# Patient Record
Sex: Male | Born: 1950 | Race: White | Hispanic: No | Marital: Single | Smoking: Current every day smoker
Health system: Southern US, Community
[De-identification: ages and names within clinical notes are randomized; demographics above are authoritative.]

## PROBLEM LIST (undated history)

## (undated) DIAGNOSIS — J449 Chronic obstructive pulmonary disease, unspecified: Secondary | ICD-10-CM

## (undated) DIAGNOSIS — I10 Essential (primary) hypertension: Secondary | ICD-10-CM

## (undated) HISTORY — PX: CARDIAC SURGERY: SHX584

---

## 2017-09-07 ENCOUNTER — Emergency Department: Payer: Medicare Other

## 2017-09-07 ENCOUNTER — Encounter: Payer: Self-pay | Admitting: Emergency Medicine

## 2017-09-07 ENCOUNTER — Inpatient Hospital Stay
Admission: EM | Admit: 2017-09-07 | Discharge: 2017-09-11 | DRG: 190 | Disposition: A | Discharge status: Home / Self Care | Payer: Medicare Other | Attending: Internal Medicine | Admitting: Internal Medicine

## 2017-09-07 ENCOUNTER — Other Ambulatory Visit: Payer: Self-pay

## 2017-09-07 DIAGNOSIS — I10 Essential (primary) hypertension: Secondary | ICD-10-CM | POA: Diagnosis present

## 2017-09-07 DIAGNOSIS — I2510 Atherosclerotic heart disease of native coronary artery without angina pectoris: Secondary | ICD-10-CM | POA: Diagnosis present

## 2017-09-07 DIAGNOSIS — J9601 Acute respiratory failure with hypoxia: Secondary | ICD-10-CM | POA: Diagnosis present

## 2017-09-07 DIAGNOSIS — J441 Chronic obstructive pulmonary disease with (acute) exacerbation: Secondary | ICD-10-CM | POA: Diagnosis present

## 2017-09-07 DIAGNOSIS — F17210 Nicotine dependence, cigarettes, uncomplicated: Secondary | ICD-10-CM | POA: Diagnosis present

## 2017-09-07 DIAGNOSIS — Z951 Presence of aortocoronary bypass graft: Secondary | ICD-10-CM

## 2017-09-07 DIAGNOSIS — Z79899 Other long term (current) drug therapy: Secondary | ICD-10-CM | POA: Diagnosis not present

## 2017-09-07 DIAGNOSIS — Z66 Do not resuscitate: Secondary | ICD-10-CM | POA: Diagnosis present

## 2017-09-07 DIAGNOSIS — J9602 Acute respiratory failure with hypercapnia: Secondary | ICD-10-CM | POA: Diagnosis present

## 2017-09-07 HISTORY — DX: Essential (primary) hypertension: I10

## 2017-09-07 HISTORY — DX: Chronic obstructive pulmonary disease, unspecified: J44.9

## 2017-09-07 LAB — BASIC METABOLIC PANEL
ANION GAP: 6 (ref 5–15)
BUN: 17 mg/dL (ref 8–23)
CHLORIDE: 100 mmol/L (ref 98–111)
CO2: 31 mmol/L (ref 22–32)
Calcium: 8.7 mg/dL — ABNORMAL LOW (ref 8.9–10.3)
Creatinine, Ser: 0.51 mg/dL — ABNORMAL LOW (ref 0.61–1.24)
GFR calc non Af Amer: >60 mL/min (ref >60)
Glucose, Bld: 127 mg/dL — ABNORMAL HIGH (ref 70–99)
POTASSIUM: 3.9 mmol/L (ref 3.5–5.1)
SODIUM: 137 mmol/L (ref 135–145)

## 2017-09-07 LAB — BLOOD GAS, VENOUS
Acid-Base Excess: 6 mmol/L — ABNORMAL HIGH (ref 0.0–2.0)
Bicarbonate: 36.2 mmol/L — ABNORMAL HIGH (ref 20.0–28.0)
O2 Saturation: 86.5 %
Patient temperature: 37
pCO2, Ven: 77 mmHg (ref 44.0–60.0)
pH, Ven: 7.28 (ref 7.250–7.430)
pO2, Ven: 59 mmHg — ABNORMAL HIGH (ref 32.0–45.0)

## 2017-09-07 LAB — CBC
HCT: 45.4 % (ref 40.0–52.0)
HEMOGLOBIN: 15.9 g/dL (ref 13.0–18.0)
MCH: 32.7 pg (ref 26.0–34.0)
MCHC: 35.1 g/dL (ref 32.0–36.0)
MCV: 93.3 fL (ref 80.0–100.0)
PLATELETS: 203 10*3/uL (ref 150–440)
RBC: 4.87 MIL/uL (ref 4.40–5.90)
RDW: 13.7 % (ref 11.5–14.5)
WBC: 9.1 10*3/uL (ref 3.8–10.6)

## 2017-09-07 LAB — TROPONIN I: Troponin I: <0.03 ng/mL (ref <0.03)

## 2017-09-07 LAB — BRAIN NATRIURETIC PEPTIDE: B NATRIURETIC PEPTIDE 5: 81 pg/mL (ref 0.0–100.0)

## 2017-09-07 LAB — ETHANOL: Alcohol, Ethyl (B): <10 mg/dL (ref <10)

## 2017-09-07 LAB — GLUCOSE, CAPILLARY: Glucose-Capillary: 145 mg/dL — ABNORMAL HIGH (ref 70–99)

## 2017-09-07 MED ORDER — ATORVASTATIN CALCIUM 20 MG PO TABS
40.0000 mg | ORAL_TABLET | Freq: Every day | ORAL | Status: DC
  Administered 2017-09-07 – 2017-09-10 (×4): 40 mg via ORAL
  Filled 2017-09-07 (×4): qty 2

## 2017-09-07 MED ORDER — ACETAMINOPHEN 325 MG PO TABS: 650.0000 mg | ORAL_TABLET | Freq: Four times a day (QID) | ORAL | Status: DC | PRN

## 2017-09-07 MED ORDER — IPRATROPIUM-ALBUTEROL 0.5-2.5 (3) MG/3ML IN SOLN
3.0000 mL | Freq: Once | RESPIRATORY_TRACT | Status: AC
  Administered 2017-09-07: 3 mL via RESPIRATORY_TRACT
  Filled 2017-09-07: qty 6

## 2017-09-07 MED ORDER — POLYETHYLENE GLYCOL 3350 17 G PO PACK
17.0000 g | PACK | Freq: Every day | ORAL | Status: DC | PRN
  Administered 2017-09-08: 17 g via ORAL

## 2017-09-07 MED ORDER — IPRATROPIUM-ALBUTEROL 0.5-2.5 (3) MG/3ML IN SOLN
3.0000 mL | Freq: Once | RESPIRATORY_TRACT | Status: AC
  Administered 2017-09-07: 3 mL via RESPIRATORY_TRACT

## 2017-09-07 MED ORDER — METHYLPREDNISOLONE SODIUM SUCC 125 MG IJ SOLR
60.0000 mg | Freq: Two times a day (BID) | INTRAMUSCULAR | Status: DC
  Administered 2017-09-07 – 2017-09-08 (×2): 60 mg via INTRAVENOUS
  Filled 2017-09-07 (×2): qty 2

## 2017-09-07 MED ORDER — ONDANSETRON HCL 4 MG/2ML IJ SOLN: 4.0000 mg | Freq: Four times a day (QID) | INTRAMUSCULAR | Status: DC | PRN

## 2017-09-07 MED ORDER — TIOTROPIUM BROMIDE MONOHYDRATE 18 MCG IN CAPS: 18.0000 ug | ORAL_CAPSULE | Freq: Every day | RESPIRATORY_TRACT | Status: DC

## 2017-09-07 MED ORDER — BUDESONIDE 0.25 MG/2ML IN SUSP
0.2500 mg | Freq: Four times a day (QID) | RESPIRATORY_TRACT | Status: DC
  Administered 2017-09-07 – 2017-09-09 (×7): 0.25 mg via RESPIRATORY_TRACT
  Filled 2017-09-07 (×7): qty 2

## 2017-09-07 MED ORDER — IPRATROPIUM-ALBUTEROL 0.5-2.5 (3) MG/3ML IN SOLN
3.0000 mL | Freq: Once | RESPIRATORY_TRACT | Status: AC
  Administered 2017-09-07: 3 mL via RESPIRATORY_TRACT
  Filled 2017-09-07: qty 3

## 2017-09-07 MED ORDER — ACETAMINOPHEN 650 MG RE SUPP: 650.0000 mg | Freq: Four times a day (QID) | RECTAL | Status: DC | PRN

## 2017-09-07 MED ORDER — IPRATROPIUM-ALBUTEROL 0.5-2.5 (3) MG/3ML IN SOLN
3.0000 mL | Freq: Four times a day (QID) | RESPIRATORY_TRACT | Status: DC
  Administered 2017-09-07 – 2017-09-11 (×14): 3 mL via RESPIRATORY_TRACT
  Filled 2017-09-07 (×17): qty 3

## 2017-09-07 MED ORDER — ORAL CARE MOUTH RINSE
15.0000 mL | Freq: Two times a day (BID) | OROMUCOSAL | Status: DC
Start: 2017-09-07 — End: 2017-09-11
  Administered 2017-09-08 – 2017-09-11 (×5): 15 mL via OROMUCOSAL

## 2017-09-07 MED ORDER — ALBUTEROL SULFATE (2.5 MG/3ML) 0.083% IN NEBU: 2.5000 mg | INHALATION_SOLUTION | RESPIRATORY_TRACT | Status: DC | PRN

## 2017-09-07 MED ORDER — ENOXAPARIN SODIUM 40 MG/0.4ML ~~LOC~~ SOLN
40.0000 mg | SUBCUTANEOUS | Status: DC
  Administered 2017-09-07 – 2017-09-10 (×4): 40 mg via SUBCUTANEOUS
  Filled 2017-09-07 (×4): qty 0.4

## 2017-09-07 MED ORDER — ONDANSETRON HCL 4 MG PO TABS: 4.0000 mg | ORAL_TABLET | Freq: Four times a day (QID) | ORAL | Status: DC | PRN

## 2017-09-07 MED ORDER — ESCITALOPRAM OXALATE 10 MG PO TABS
10.0000 mg | ORAL_TABLET | Freq: Every day | ORAL | Status: DC
  Administered 2017-09-08 – 2017-09-11 (×4): 10 mg via ORAL
  Filled 2017-09-07 (×4): qty 1

## 2017-09-07 MED ORDER — METHYLPREDNISOLONE SODIUM SUCC 125 MG IJ SOLR
125.0000 mg | Freq: Once | INTRAMUSCULAR | Status: AC
  Administered 2017-09-07: 125 mg via INTRAVENOUS
  Filled 2017-09-07: qty 2

## 2017-09-07 MED ORDER — IPRATROPIUM-ALBUTEROL 0.5-2.5 (3) MG/3ML IN SOLN
3.0000 mL | RESPIRATORY_TRACT | Status: DC
  Administered 2017-09-07: 3 mL via RESPIRATORY_TRACT
  Filled 2017-09-07: qty 3

## 2017-09-07 NOTE — Care Management (Signed)
[place] April MansonShrea [phone] notified of patient admit to ICU on Bipap.

## 2017-09-07 NOTE — ED Triage Notes (Signed)
Pt states cp and shob for the past few days now, sats 82% on RA, sats 90% on 3L per [place], pt unable to speak in full sentences, hx of CABG 3 years ago.

## 2017-09-07 NOTE — ED Notes (Signed)
Date and time results received: 09/07/17 1257 (use smartphrase ".now" to insert current time)  Test: pCO2 77   Name of Provider Notified: Dr. Willy EddyPatrick Robinson

## 2017-09-07 NOTE — ED Notes (Signed)
Attempt to call report X 1, unsuccessful.  

## 2017-09-07 NOTE — ED Notes (Signed)
Report to Sabrina, RN  

## 2017-09-07 NOTE — ED Notes (Signed)
Pt taken off of BIPAP and placed on 3L [place] per Dr. Roxan Hockeyobinson verbal order. Pt states that he feels much better.

## 2017-09-07 NOTE — ED Provider Notes (Signed)
[facility] Emergency Department Provider Note    First MD Initiated Contact with Patient 09/07/17 1113     (approximate)  I have reviewed the triage vital signs and the nursing notes.   HISTORY  Chief Complaint Chest Pain and Shortness of Breath    HPI Sharlee BlewFreddy Brands is a 67 y.o. male history of COPD not on home oxygen as well as a history of CABG presents the ER with shortness of breath got progressively worse at 8 AM.  States that he did have shortness of breath and cough for the past 3 days.  Denies any chest pain.  Has noticed dorsal swelling in his lower extremities.  Patient arrives to triage in respiratory distress cyanotic and saturating 80% on room air .  Past Medical History:  Diagnosis Date  . COPD (chronic obstructive pulmonary disease) (HCC)   . Hypertension    No family history on file. Past Surgical History:  Procedure Laterality Date  . CARDIAC SURGERY     There are no active problems to display for this patient.     Prior to Admission medications   Not on File    Allergies Patient has no known allergies.    Social History Social History   Tobacco Use  . Smoking status: Current Every Day Smoker    Packs/day: 2.00  . Smokeless tobacco: Never Used  Substance Use Topics  . Alcohol use: Not Currently  . Drug use: Not on file    Review of Systems Patient denies headaches, rhinorrhea, blurry vision, numbness, shortness of breath, chest pain, edema, cough, abdominal pain, nausea, vomiting, diarrhea, dysuria, fevers, rashes or hallucinations unless otherwise stated above in HPI. ____________________________________________   PHYSICAL EXAM:  VITAL SIGNS: Vitals:   09/07/17 1230 09/07/17 1245  BP: 111/68   Pulse: (!) 58 64  Resp: 17 (!) 21  Temp:    SpO2: 100% 97%    Constitutional: Alert and oriented. IN MODERATE RESPIRATORY DISTRESS TRIPODDING Eyes: Conjunctivae are normal.  Head: Atraumatic. Nose: No  congestion/rhinnorhea. Mouth/Throat: Mucous membranes are moist.   Neck: No stridor. Painless ROM.  Cardiovascular: Normal rate, regular rhythm. Grossly normal heart sounds.  Good peripheral circulation. Respiratory: TACHYPNEA WITH RESPIRATORY DISTRESS, diminished breathsounds throughout, use of accessory muscles Gastrointestinal: Soft and nontender. No distention. No abdominal bruits. No CVA tenderness. Genitourinary:  Musculoskeletal: No lower extremity tenderness nor edema.  No joint effusions. Neurologic:  Normal speech and language. No gross focal neurologic deficits are appreciated. No facial droop Skin:  Skin is warm, dry and intact. No rash noted. Psychiatric: Mood and affect are normal. Speech and behavior are normal.  ____________________________________________   LABS (all labs ordered are listed, but only abnormal results are displayed)  Results for orders placed or performed during the hospital encounter of 09/07/17 (from the past 24 hour(s))  Basic metabolic panel     Status: Abnormal   Collection Time: 09/07/17 11:09 AM  Result Value Ref Range   Sodium 137 135 - 145 mmol/L   Potassium 3.9 3.5 - 5.1 mmol/L   Chloride 100 98 - 111 mmol/L   CO2 31 22 - 32 mmol/L   Glucose, Bld 127 (H) 70 - 99 mg/dL   BUN 17 8 - 23 mg/dL   Creatinine, Ser 4.090.51 (L) 0.61 - 1.24 mg/dL   Calcium 8.7 (L) 8.9 - 10.3 mg/dL   GFR calc non Af Amer >60 >60 mL/min   GFR calc Af Amer >60 >60 mL/min   Anion gap 6  5 - 15  CBC     Status: None   Collection Time: 09/07/17 11:09 AM  Result Value Ref Range   WBC 9.1 3.8 - 10.6 K/uL   RBC 4.87 4.40 - 5.90 MIL/uL   Hemoglobin 15.9 13.0 - 18.0 g/dL   HCT 11.9 14.7 - 82.9 %   MCV 93.3 80.0 - 100.0 fL   MCH 32.7 26.0 - 34.0 pg   MCHC 35.1 32.0 - 36.0 g/dL   RDW 56.2 13.0 - 86.5 %   Platelets 203 150 - 440 K/uL  Troponin I     Status: None   Collection Time: 09/07/17 11:09 AM  Result Value Ref Range   Troponin I <0.03 <0.03 ng/mL  Blood gas, venous      Status: Abnormal   Collection Time: 09/07/17 12:49 PM  Result Value Ref Range   pH, Ven 7.28 7.250 - 7.430   pCO2, Ven 77 (HH) 44.0 - 60.0 mmHg   pO2, Ven 59.0 (H) 32.0 - 45.0 mmHg   Bicarbonate 36.2 (H) 20.0 - 28.0 mmol/L   Acid-Base Excess 6.0 (H) 0.0 - 2.0 mmol/L   O2 Saturation 86.5 %   Patient temperature 37.0    Collection site VEIN    Sample type VENOUS   Ethanol     Status: None   Collection Time: 09/07/17 12:49 PM  Result Value Ref Range   Alcohol, Ethyl (B) <10 <10 mg/dL   ____________________________________________  EKG My review and personal interpretation at Time: 11:00   Indication: resp distrss  Rate: 70  Rhythm: sinus Axis: normal Other: normal intervals, no stemi ____________________________________________  RADIOLOGY  I personally reviewed all radiographic images ordered to evaluate for the above acute complaints and reviewed radiology reports and findings.  These findings were personally discussed with the patient.  Please see medical record for radiology report.  ____________________________________________   PROCEDURES  Procedure(s) performed:  .Critical Care Performed by: Willy Eddy, MD Authorized by: Willy Eddy, MD   Critical care provider statement:    Critical care time (minutes):  40   Critical care time was exclusive of:  Separately billable procedures and treating other patients   Critical care was necessary to treat or prevent imminent or life-threatening deterioration of the following conditions:  Respiratory failure   Critical care was time spent personally by me on the following activities:  Development of treatment plan with patient or surrogate, discussions with consultants, evaluation of patient's response to treatment, examination of patient, obtaining history from patient or surrogate, ordering and performing treatments and interventions, ordering and review of laboratory studies, ordering and review of radiographic  studies, pulse oximetry, re-evaluation of patient's condition and review of old charts      Critical Care performed: yes ____________________________________________   INITIAL IMPRESSION / ASSESSMENT AND PLAN / ED COURSE  Pertinent labs & imaging results that were available during my care of the patient were reviewed by me and considered in my medical decision making (see chart for details).   DDX: Asthma, copd, CHF, pna, ptx, malignancy, Pe, anemia   Ankith Edmonston is a 67 y.o. who presents to the ED with respiratory distress as described above he is afebrile but acutely hypoxic requiring supplemental oxygen.  Currently protecting his airway but is tripoding.  Will be placed on BiPAP.  The patient will be placed on continuous pulse oximetry and telemetry for monitoring.  Laboratory evaluation will be sent to evaluate for the above complaints.     Clinical Course as of Sep 08 1323  Mon Sep 07, 2017  1148 Patient reassessed with improvement in symptoms.  Breathing much better on BiPAP.  Chest x-ray does show evidence of probable COPD.   [PR]  1243 Will trial off of BiPAP now having received 3 nebulizer treatments with improvement in symptoms.   [PR]  1323 Patient with evidence of hypercapnic respiratory failure.  He is sleepy off of BiPAP therefore will restart BiPAP.  He is protecting his airway and does feel that he is improving but will likely need additional time on positive pressure ventilation as well as continue nebulizer treatments.  Have discussed with the patient and available family all diagnostics and treatments performed thus far and all questions were answered to the best of my ability. The patient demonstrates understanding and agreement with plan.    [PR]    Clinical Course User Index [PR] Willy Eddyobinson, Keltie Labell, MD     As part of my medical decision making, I reviewed the following data within the electronic MEDICAL RECORD NUMBER Nursing notes reviewed and incorporated, Labs  reviewed, notes from prior ED visits   ____________________________________________   FINAL CLINICAL IMPRESSION(S) / ED DIAGNOSES  Final diagnoses:  Acute respiratory failure with hypoxia and hypercapnia (HCC)  COPD exacerbation (HCC)      NEW MEDICATIONS STARTED DURING THIS VISIT:  New Prescriptions   No medications on file     Note:  This document was prepared using Dragon voice recognition software and may include unintentional dictation errors.    Willy Eddyobinson, Eulalia Ellerman, MD 09/07/17 1325

## 2017-09-07 NOTE — ED Notes (Signed)
First Nurse Note: Patient states he feels pressure on his chest and SHOB.  Pulse ox - 82% on RA.  Patient states he has used home O2 in the past at 3L.  Does not currently use.  Placed on 3L via [place] - pulse ox 90%.

## 2017-09-07 NOTE — ED Notes (Signed)
Attempt to call report X 2, unsuccessful.

## 2017-09-07 NOTE — ED Notes (Signed)
Pt placed back on BIPAP per verbal order by Dr. Willy EddyPatrick Robinson.

## 2017-09-07 NOTE — Progress Notes (Signed)
   09/07/17 1800  Clinical Encounter Type  Visited With Patient  Visit Type Initial   Provided Advanced Directives education upon patient request.  He verbalized understanding and had no questions.  Copy of AD left with patient.  Instructed to request chaplain if he decides to proceed.

## 2017-09-07 NOTE — Consult Note (Addendum)
Name: Vincent Giles MRN: [id number] DOB: 1950/09/14    ADMISSION DATE:  09/07/2017 CONSULTATION DATE:  09/07/2017  REFERRING MD :  Dr. Elpidio AnisSudini  CHIEF COMPLAINT:  Shortness of breath, chest pain  BRIEF PATIENT DESCRIPTION:  67 year old male with Acute Hypoxic Hypercarbic Respiratory Failure in setting of AECOPD, requiring BiPAP.  SIGNIFICANT EVENTS  09/07/17>> Admission to [facility] Stepdown  STUDIES:  CXR 8/12>> No acute disease post CABG. 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 ECHO>>   HISTORY OF PRESENT ILLNESS:   [name] is a 67 year old male past medical history as listed below, who presented to [facility] ED on 09/07/17 with complaints of shortness of breath and chest pain.  He denies fever or chills. He reports he has had a cold for 3 or 4 days, and clear sputum.  He also reports things to smoke 1-1.5 packs of cigarettes per day.  Upon arrival to the ED, he was hypoxic O2 sats in the 80s with increased work of breathing, requiring BiPAP.  In the ED, CXR negative for acute disease, Troponin negative, BNP 81.  He was admitted to [facility] stepdown unit for treatment of acute hypoxic hypercapnic respiratory failure requiring BiPAP in the setting of AECOPD.  PCCM is consulted for further management.  PAST MEDICAL HISTORY :   has a past medical history of COPD (chronic obstructive pulmonary disease) (HCC) and Hypertension.  has a past surgical history that includes Cardiac surgery. Prior to Admission medications   Medication Sig Start Date End Date Taking? Authorizing Provider  albuterol (PROVENTIL HFA;VENTOLIN HFA) 108 (90 Base) MCG/ACT inhaler Inhale 2 puffs into the lungs every 4 (four) hours as needed for wheezing or shortness of breath.   Yes [provider]  atorvastatin (LIPITOR) 80 MG tablet Take 40 mg by mouth at bedtime.   Yes [provider]  diltiazem (DILACOR XR) 180 MG 24 hr capsule Take 180 mg by mouth daily.   Yes [provider]  escitalopram  (LEXAPRO) 10 MG tablet Take 10 mg by mouth daily.   Yes [provider]  hydrochlorothiazide (HYDRODIURIL) 25 MG tablet Take 25 mg by mouth daily.   Yes [provider]  metoprolol tartrate (LOPRESSOR) 50 MG tablet Take 50 mg by mouth 2 (two) times daily.   Yes [provider]  potassium chloride SA (K-DUR,KLOR-CON) 20 MEQ tablet Take 20 mEq by mouth 2 (two) times daily.   Yes [provider]  tiotropium (SPIRIVA) 18 MCG inhalation capsule Place 18 mcg into inhaler and inhale daily.   Yes [provider]   No Known Allergies  FAMILY HISTORY:  family history is not on file. SOCIAL HISTORY:  reports that he has been smoking. He has been smoking about 2.00 packs per day. He has never used smokeless tobacco. He reports that he drank alcohol.  REVIEW OF SYSTEMS:  Positives in BOLD Constitutional: Negative for fever, chills, weight loss, malaise/fatigue and diaphoresis.  HENT: Negative for hearing loss, ear pain, nosebleeds, congestion, sore throat, neck pain, tinnitus and ear discharge.   Eyes: Negative for blurred vision, double vision, photophobia, pain, discharge and redness.  Respiratory: Negative for +cough, hemoptysis, sputum production, +shortness of breath, wheezing and stridor.   Cardiovascular: Negative for chest pain, palpitations, orthopnea, claudication, leg swelling and PND.  Gastrointestinal: Negative for heartburn, nausea, vomiting, abdominal pain, diarrhea, constipation, blood in stool and melena.  Genitourinary: Negative for dysuria, urgency, frequency, hematuria and flank pain.  Musculoskeletal: Negative for myalgias, back pain, joint pain and falls.  Skin: Negative for itching and rash.  Neurological: Negative for dizziness, tingling, tremors, sensory change, speech change, focal weakness, seizures, loss of consciousness, weakness and headaches.  Endo/Heme/Allergies: Negative for environmental allergies and polydipsia. Does not  bruise/bleed easily.  SUBJECTIVE:  Pt states he is feeling much better since admission Denies chest pain, reports slight shortness of breath On 3L [place] (weaned from BiPAP since around 1200)  VITAL SIGNS: Temp:  [98 F (36.7 C)-98.1 F (36.7 C)] 98 F (36.7 C) (08/12 1605) Pulse Rate:  [54-69] 67 (08/12 1700) Resp:  [13-22] 20 (08/12 1700) BP: (102-158)/(63-87) 131/70 (08/12 1700) SpO2:  [93 %-100 %] 96 % (08/12 1700) Weight:  [96.2 kg] 96.2 kg (08/12 1104)  PHYSICAL EXAMINATION: General:  Acutely ill appearing male, sitting in bed, on 3L [place], in NAD Neuro:  Awake, A&O x4, follows commands, no focal deficits HEENT: Atraumatic, normocephalic, neck supple, no JVD Cardiovascular:  RRR, s1s2, no M/R/G, palpable pulses throughout Lungs:  Slight expiratory wheezing bilaterally, even, non-labored Abdomen:  Soft, nontender,nondistended, BS+ x4 Musculoskeletal:  No deformities, active ROM all extremities Skin:  No obvious rashes, lesions, or ulcerations  Recent Labs  Lab 09/07/17 1109  NA 137  K 3.9  CL 100  CO2 31  BUN 17  CREATININE 0.51*  GLUCOSE 127*   Recent Labs  Lab 09/07/17 1109  HGB 15.9  HCT 45.4  WBC 9.1  PLT 203   Dg Chest Portable 1 View  Result Date: 09/07/2017 CLINICAL DATA:  CP and SOB that started apprix 2130 09/06/17. Pt appears SOB. Unable to lie back, unable to speak in full sentences EXAM: PORTABLE CHEST - 1 VIEW COMPARISON:  none FINDINGS: Attenuated bronchovascular markings at the right lung apex suggesting severe bullous disease. No focal airspace disease or overt edema. Heart size normal.  Previous CABG. No effusion.  No pneumothorax. Previous median sternotomy. IMPRESSION: 1. No acute disease post CABG. 2. 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 Electronically Signed   By: Corlis Leak  Hassell M.D.   On: 09/07/2017 12:00    ASSESSMENT / PLAN:  A: Acute Hypoxic Hypercarbic Respiratory Failure in setting of AECOPD P: Supplemental O2 to maintain O2  sats 88 to 92% BiPAP as needed Continue Bronchodilators & ICS Continue Solu-Medrol 60 mg q12h Will need to be set up as outpatient with Dr. Sung AmabileSimonds  ECHO pending Smoking cessation counseling Lovenox for VTE prophylaxis   Disposition: Stepdown Updates: Updated pt at bedside 09/07/17.  No family present during NP rounds.   Harlon DittyJeremiah Savi Lastinger, AGACNP-BC [facility] Pager: [phone]   09/07/2017, 6:05 PM

## 2017-09-07 NOTE — Care Management (Signed)
RNCM met with patient in ICU however he is short of breath and undecided if he wants to transfer to [place].  I have [facility] all forms for [facility] at bedside. RNCM will follow up with patient when he is more stable. O2 is acute.

## 2017-09-07 NOTE — ED Notes (Signed)
Awaiting RT to come and assist with transport.

## 2017-09-07 NOTE — ED Notes (Signed)
Attempting to call RT for transport.

## 2017-09-07 NOTE — H&P (Signed)
[facility] [place] [facility]   PATIENT NAME: Vincent Giles    MR#:  [id number]  DATE OF BIRTH:  Jan 20, 1951  DATE OF ADMISSION:  09/07/2017  PRIMARY CARE PHYSICIAN: [facility]   REQUESTING/REFERRING PHYSICIAN: Dr. Roxan Hockeyobinson  CHIEF COMPLAINT:   Chief Complaint  Patient presents with  . Chest Pain  . Shortness of Breath    HISTORY OF PRESENT ILLNESS:  Vincent Giles  is a 67 y.o. male with a known history of COPD, tobacco use, hypertension, CAD status post CABG in 2003 presents to the emergency room complaining of worsening shortness of breath, wheezing and weakness since Saturday.  On arrival patient was found to have saturations in the low 80s and tachypneic.  Placed on BiPAP support.  Attempt was made to remove BiPAP but patient had to be replaced on it.  Chest x-ray shows no acute edema or infiltrates.  He does have clear productive sputum.  Afebrile.  Patient continues to smoke instead of having advanced COPD.  No chest pain or orthopnea.  Has chronic lower extremity edema that is unchanged.  He tells me he has a new physician in town whose name he does not remember.  PAST MEDICAL HISTORY:   Past Medical History:  Diagnosis Date  . COPD (chronic obstructive pulmonary disease) (HCC)   . Hypertension     PAST SURGICAL HISTORY:   Past Surgical History:  Procedure Laterality Date  . CARDIAC SURGERY      SOCIAL HISTORY:   Social History   Tobacco Use  . Smoking status: Current Every Day Smoker    Packs/day: 2.00  . Smokeless tobacco: Never Used  Substance Use Topics  . Alcohol use: Not Currently    FAMILY HISTORY:  No family history on file.  DRUG ALLERGIES:  No Known Allergies  REVIEW OF SYSTEMS:   Review of Systems  Constitutional: Negative for chills and fever.  HENT: Negative for sore throat.   Eyes: Negative for blurred vision, double vision and pain.  Respiratory: Negative for cough, hemoptysis, shortness of breath and  wheezing.   Cardiovascular: Negative for chest pain, palpitations, orthopnea and leg swelling.  Gastrointestinal: Negative for abdominal pain, constipation, diarrhea, heartburn, nausea and vomiting.  Genitourinary: Negative for dysuria and hematuria.  Musculoskeletal: Negative for back pain and joint pain.  Skin: Negative for rash.  Neurological: Negative for sensory change, speech change, focal weakness and headaches.  Endo/Heme/Allergies: Does not bruise/bleed easily.  Psychiatric/Behavioral: Negative for depression. The patient is not nervous/anxious.     MEDICATIONS AT HOME:   Prior to Admission medications   Not on File     VITAL SIGNS:  Blood pressure 102/63, pulse (!) 57, temperature 98.1 F (36.7 C), temperature source Oral, resp. rate 15, height 6\' 3"  (1.905 m), weight 96.2 kg, SpO2 99 %.  PHYSICAL EXAMINATION:  Physical Exam  .  General-laying in bed.  In respiratory distress.  Looks critically ill EYES: Pupils equal, round, reactive to light and accommodation. No scleral icterus. Extraocular muscles intact.  HEENT: Head atraumatic, normocephalic. Oropharynx and nasopharynx clear. No oropharyngeal erythema, moist oral mucosa  NECK:  Supple, no jugular venous distention. No thyroid enlargement, no tenderness.  LUNGS: Bilateral wheezing and decreased air entry CARDIOVASCULAR: S1, S2 normal. No murmurs, rubs, or gallops.  ABDOMEN: Soft, nontender, nondistended. Bowel sounds present. No organomegaly or mass.  EXTREMITIES: No cyanosis, or clubbing. + 2 pedal & radial pulses b/l.  Bilateral lower committee edema NEUROLOGIC: Cranial nerves II through  XII are intact. No focal Motor or sensory deficits appreciated b/l PSYCHIATRIC: The patient is alert and oriented x 3.  SKIN: No obvious rash, lesion, or ulcer.   LABORATORY PANEL:   CBC Recent Labs  Lab 09/07/17 1109  WBC 9.1  HGB 15.9  HCT 45.4  PLT 203    ------------------------------------------------------------------------------------------------------------------  Chemistries  Recent Labs  Lab 09/07/17 1109  NA 137  K 3.9  CL 100  CO2 31  GLUCOSE 127*  BUN 17  CREATININE 0.51*  CALCIUM 8.7*   ------------------------------------------------------------------------------------------------------------------  Cardiac Enzymes Recent Labs  Lab 09/07/17 1109  TROPONINI <0.03   ------------------------------------------------------------------------------------------------------------------  RADIOLOGY:  Dg Chest Portable 1 View  Result Date: 09/07/2017 CLINICAL DATA:  CP and SOB that started apprix 2130 09/06/17. Pt appears SOB. Unable to lie back, unable to speak in full sentences EXAM: PORTABLE CHEST - 1 VIEW COMPARISON:  none FINDINGS: Attenuated bronchovascular markings at the right lung apex suggesting severe bullous disease. No focal airspace disease or overt edema. Heart size normal.  Previous CABG. No effusion.  No pneumothorax. Previous median sternotomy. IMPRESSION: 1. No acute disease post CABG. 2. 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 Electronically Signed   By: Corlis Leak  Hassell M.D.   On: 09/07/2017 12:00     IMPRESSION AND PLAN:   *Acute COPD exacerbation with acute hypoxic and hypercapnic respiratory failure Had to be placed back on BiPAP after removing in the emergency room.  Critically ill -IV steroids - Scheduled Nebulizers - Inhalers -Wean Bipap as tolerated - Consult pulmonary.  Discussed with Dr. Bard HerbertSimmonds Counseled patient to quit smoking for greater than 3 minutes.  *Hypertension.  Hold blood pressure medications due to low normal blood pressure  *CAD status post CABG. No acute chest pain.  DVT prophylaxis with Lovenox   All the records are reviewed and case discussed with ED provider. Management plans discussed with the patient, family and they are in agreement.  CODE STATUS:  DNR  TOTAL TIME TAKING CARE OF THIS PATIENT: 60 minutes   Discussed with patient regarding his COPD, CODE STATUS. He does not have a designated healthcare power of attorney.  Does not have any family locally.  She understands his advanced COPD.  Discussed regarding his critical illness being on BiPAP and being admitted to ICU.  Patient understands high risk for deterioration.  We discussed regarding intubation/ventilator/CPR.  He wishes to be DO NOT RESUSCITATE DO NOT INTUBATE.  Orders entered.  Additional time spent 20 minutes .   Orie FishermanSrikar R Kolby Schara M.D on 09/07/2017 at 2:07 PM  Between 7am to 6pm - Pager - [phone]  After 6pm go to [email or web address] - password [email or web address]  [facility] [place] [facility]  Office  [phone]  CC: Primary care physician; [facility]  Note: This dictation was prepared with Dragon dictation along with smaller phrase technology. Any transcriptional errors that result from this process are unintentional.

## 2017-09-07 NOTE — ED Notes (Signed)
CP and SOB that started apprix 2130 09/06/17. Pt appears SOB. Unable to lie back, unable to speak in full sentences.

## 2017-09-08 ENCOUNTER — Inpatient Hospital Stay (HOSPITAL_COMMUNITY)
Admit: 2017-09-08 | Discharge: 2017-09-08 | Disposition: A | Discharge status: Home / Self Care | Payer: Medicare Other | Attending: Internal Medicine | Admitting: Internal Medicine

## 2017-09-08 DIAGNOSIS — J441 Chronic obstructive pulmonary disease with (acute) exacerbation: Principal | ICD-10-CM

## 2017-09-08 DIAGNOSIS — F17200 Nicotine dependence, unspecified, uncomplicated: Secondary | ICD-10-CM

## 2017-09-08 DIAGNOSIS — I5030 Unspecified diastolic (congestive) heart failure: Secondary | ICD-10-CM

## 2017-09-08 LAB — CBC
HCT: 45 % (ref 40.0–52.0)
Hemoglobin: 15.5 g/dL (ref 13.0–18.0)
MCH: 32.5 pg (ref 26.0–34.0)
MCHC: 34.5 g/dL (ref 32.0–36.0)
MCV: 94.3 fL (ref 80.0–100.0)
PLATELETS: 178 10*3/uL (ref 150–440)
RBC: 4.78 MIL/uL (ref 4.40–5.90)
RDW: 13.7 % (ref 11.5–14.5)
WBC: 7.4 10*3/uL (ref 3.8–10.6)

## 2017-09-08 LAB — BASIC METABOLIC PANEL
Anion gap: 3 — ABNORMAL LOW (ref 5–15)
BUN: 16 mg/dL (ref 8–23)
CO2: 37 mmol/L — ABNORMAL HIGH (ref 22–32)
CREATININE: 0.56 mg/dL — AB (ref 0.61–1.24)
Calcium: 8.8 mg/dL — ABNORMAL LOW (ref 8.9–10.3)
Chloride: 99 mmol/L (ref 98–111)
GFR calc Af Amer: >60 mL/min (ref >60)
Glucose, Bld: 137 mg/dL — ABNORMAL HIGH (ref 70–99)
Potassium: 4.1 mmol/L (ref 3.5–5.1)
SODIUM: 139 mmol/L (ref 135–145)

## 2017-09-08 LAB — ECHOCARDIOGRAM COMPLETE
Height: 75 in
WEIGHTICAEL: 3156.99 [oz_av]

## 2017-09-08 MED ORDER — NICOTINE 14 MG/24HR TD PT24
14.0000 mg | MEDICATED_PATCH | Freq: Every day | TRANSDERMAL | Status: DC
  Filled 2017-09-08 (×4): qty 1

## 2017-09-08 MED ORDER — GUAIFENESIN-DM 100-10 MG/5ML PO SYRP
5.0000 mL | ORAL_SOLUTION | ORAL | Status: DC | PRN
  Administered 2017-09-08 – 2017-09-09 (×2): 5 mL via ORAL
  Filled 2017-09-08 (×3): qty 5

## 2017-09-08 MED ORDER — METHYLPREDNISOLONE SODIUM SUCC 40 MG IJ SOLR
40.0000 mg | Freq: Two times a day (BID) | INTRAMUSCULAR | Status: DC
  Administered 2017-09-08 – 2017-09-11 (×6): 40 mg via INTRAVENOUS
  Filled 2017-09-08 (×6): qty 1

## 2017-09-08 NOTE — Progress Notes (Signed)
[facility] to room 147 with personal belongings on telemeter and oxygen.

## 2017-09-08 NOTE — Progress Notes (Signed)
Patient c/o cough to respiratory therapy and NP Elvina SidleKeene made aware and order robitussin

## 2017-09-08 NOTE — Progress Notes (Signed)
*  PRELIMINARY RESULTS* Echocardiogram 2D Echocardiogram has been performed.  Cristela BlueHege, Lolah Coghlan 09/08/2017, 9:43 AM

## 2017-09-08 NOTE — Progress Notes (Signed)
He feels better but not back to his baseline Continues to have SOB with minimal exertion No distress at rest No new complaints He assures me that he will quit smoking  Vitals:   09/08/17 0900 09/08/17 1109 09/08/17 1200 09/08/17 1315  BP: (!) 123/56 (!) 120/47 (!) 138/50   Pulse: 79 67 66   Resp: 16 14 15    Temp:      TempSrc:      SpO2: 94% 96% 96% 93%  Weight:      Height:        Gen: NAD at rest HEENT: NCAT, sclera white Neck: No JVD Lungs: Hyperresonant to percussion, breath sounds diffusely diminished with few scattered wheezes Cardiovascular: RRR, no murmurs Abdomen: Soft, nontender, normal BS Ext: without clubbing, cyanosis, edema Neuro: grossly intact Skin: Limited exam, no lesions noted   BMP Latest Ref Rng & Units 09/08/2017 09/07/2017  Glucose 70 - 99 mg/dL 086(V137(H) 784(O127(H)  BUN 8 - 23 mg/dL 16 17  Creatinine 9.620.61 - 1.24 mg/dL 9.52(W0.56(L) 4.13(K0.51(L)  Sodium 135 - 145 mmol/L 139 137  Potassium 3.5 - 5.1 mmol/L 4.1 3.9  Chloride 98 - 111 mmol/L 99 100  CO2 22 - 32 mmol/L 37(H) 31  Calcium 8.9 - 10.3 mg/dL 4.4(W8.8(L) 1.0(U8.7(L)   CBC Latest Ref Rng & Units 09/08/2017 09/07/2017  WBC 3.8 - 10.6 K/uL 7.4 9.1  Hemoglobin 13.0 - 18.0 g/dL 72.515.5 36.615.9  Hematocrit 44.040.0 - 52.0 % 45.0 45.4  Platelets 150 - 440 K/uL 178 203   CXR 8/12: Elevated left hemidiaphragm, prominent pulmonary arteries, no acute findings  IMPRESSION: 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

## 2017-09-08 NOTE — Progress Notes (Signed)
"  help for smokers and other tobacco users" book given to patient for help with smoking cessation.

## 2017-09-08 NOTE — Clinical Social Work Note (Signed)
Clinical Social Work Assessment  Patient Details  Name: Brevan Luberto MRN: [id number] Date of Birth: 1950/07/14  Date of referral:  09/08/17               Reason for consult:  [employer], Ecologist sought to share information with:    Permission granted to share information::     Name::        Agency::     Relationship::     Contact Information:     Housing/Transportation Living arrangements for the past 2 months:  Single Family Home Source of Information:  Patient Patient Interpreter Needed:  None Criminal Activity/Legal Involvement Pertinent to Current Situation/Hospitalization:  No - Comment as needed Significant Relationships:  Adult Children Lives with:  Self Do you feel safe going back to the place where you live?  Yes Need for family participation in patient care:  Yes (Comment)  Care giving concerns:  Patient lives alone in [place].    Social Worker assessment / plan:  Holiday representative (CSW) received consult for transportation needs. CSW met with patient alone at bedside to offer resources. Patient was alert and oriented X4 and was laying in the bed. CSW introduced self and explained role of CSW department. Per patient he lives alone in [place] and has 2 adult children, 1 son and 1 daughter. Patient reported that he receives a monthly disability check and goes to the [facility] for healthcare. Per patient he does not drive or have a car. Patient reported that he uses the bus or taxi for transportation. Per patient he is on room air at baseline. Patient reported that he does not have a HPOA but would be interested in completing the paper work. RN case manager placed chaplain consult. Per patient he has access to food and has an overstock of beans at his home. Patient also has a cell phone and charger. CSW provided patient with a list of [place] resources including [place]. Patient accepted  resources and thanked CSW for visit. Per patient his plan is to return home once discharged. RN case manager aware of above. CSW will continue to follow and assist as needed.      Employment status:  Disabled (Comment on whether or not currently receiving Disability) Insurance information:  Medicare PT Recommendations:  Not assessed at this time Information / Referral to community resources:  Other (Comment Required)([employer] )  Patient/Family's Response to care:  Patient accepted resources.   Patient/Family's Understanding of and Emotional Response to Diagnosis, Current Treatment, and Prognosis:  Patient was very pleasant and thanked CSW for visit.   Emotional Assessment Appearance:  Appears stated age Attitude/Demeanor/Rapport:    Affect (typically observed):  Accepting, Adaptable, Pleasant Orientation:  Oriented to Self, Oriented to Place, Oriented to  Time, Oriented to Situation Alcohol / Substance use:  Not Applicable Psych involvement (Current and /or in the community):  No (Comment)  Discharge Needs  Concerns to be addressed:  No discharge needs identified Readmission within the last 30 days:  No Current discharge risk:  Inadequate Financial Supports Barriers to Discharge:  Continued Medical Work up   [employer], Veronia Beets, LCSW 09/08/2017, 3:35 PM

## 2017-09-08 NOTE — Progress Notes (Addendum)
Chaplain received an OR to complete or update an AD. Patient was enjoying dinner, but welcomed chaplain in. Patient requested that paperwork be left for later review. Chaplain obliged and encouraged patient to ask nurse to call or page if needed.   09/08/17 1700  Clinical Encounter Type  Visited With Patient  Visit Type Initial

## 2017-09-08 NOTE — Progress Notes (Signed)
   [facility] - [place] at [facility]   PATIENT NAME: Vincent Giles    MR#:  [id number]  DATE OF BIRTH:  August 04, 1950  SUBJECTIVE:  CHIEF COMPLAINT:   Chief Complaint  Patient presents with  . Chest Pain  . Shortness of Breath    REVIEW OF SYSTEMS:  ROS  DRUG ALLERGIES:  No Known Allergies VITALS:  Blood pressure 129/70, pulse (!) 103, temperature 98.1 F (36.7 C), temperature source Oral, resp. rate (!) 22, height 6\' 3"  (1.905 m), weight 89.5 kg, SpO2 95 %. PHYSICAL EXAMINATION:  Physical Exam LABORATORY PANEL:  Male CBC Recent Labs  Lab 09/08/17 0551  WBC 7.4  HGB 15.5  HCT 45.0  PLT 178   ------------------------------------------------------------------------------------------------------------------ Chemistries  Recent Labs  Lab 09/08/17 0551  NA 139  K 4.1  CL 99  CO2 37*  GLUCOSE 137*  BUN 16  CREATININE 0.56*  CALCIUM 8.8*   RADIOLOGY:  No results found. ASSESSMENT AND PLAN:   *Acute respiratory failure with hypoxia and hypercapnia due to COPD exacerbation. He is off BiPAP, on oxygen by nasal cannula. Continue IV steroids, Scheduled Nebulizers, Robitussin as needed.  *Hypertension.  Hold blood pressure medications due to low normal blood pressure  *CAD status post CABG. Continue home medication.  Tobacco abuse.  Smoking cessation was counseled for 3 to 4 minutes.  Nicotine patch.  All the records are reviewed and case discussed with Care Management/Social Worker. Management plans discussed with the patient, family and they are in agreement.  CODE STATUS: DNR  TOTAL TIME TAKING CARE OF THIS PATIENT: 37 minutes.   More than 50% of the time was spent in counseling/coordination of care: YES  POSSIBLE D/C IN 2 DAYS, DEPENDING ON CLINICAL CONDITION.   Shaune PollackQing Cayson Kalb M.D on 09/08/2017 at 4:27 PM  Between 7am to 6pm - Pager - [phone]  After 6pm go to [email or web address] - Therapist, nutritionalpassword EPAS [facility]  [facility]   Hospitalists

## 2017-09-08 NOTE — Care Management (Signed)
Patient currently resting in bed ICU on supplemental O2 via nasal cannula. Patient still complaining of shortness of breath and wishes to remain at [facility].  [place]. [facility] [place] [facility]- copy placed on patient's chart.

## 2017-09-09 LAB — HIV ANTIBODY (ROUTINE TESTING W REFLEX): HIV SCREEN 4TH GENERATION: NONREACTIVE

## 2017-09-09 MED ORDER — BUDESONIDE 0.25 MG/2ML IN SUSP
0.2500 mg | Freq: Two times a day (BID) | RESPIRATORY_TRACT | Status: DC
  Administered 2017-09-09 – 2017-09-11 (×4): 0.25 mg via RESPIRATORY_TRACT
  Filled 2017-09-09 (×5): qty 2

## 2017-09-09 MED ORDER — FUROSEMIDE 10 MG/ML IJ SOLN
40.0000 mg | Freq: Every day | INTRAMUSCULAR | Status: DC
Start: 2017-09-09 — End: 2017-09-10
  Administered 2017-09-09: 40 mg via INTRAVENOUS
  Filled 2017-09-09: qty 4

## 2017-09-09 NOTE — Plan of Care (Signed)

## 2017-09-09 NOTE — Progress Notes (Signed)
[facility]* [facility] [place] [facility] Medicine     Assessment and Plan:  AE COPD. Acute severe hypoxic respiratory failure with slow recovery. Nicotine abuse.  -Continue nebulizer, steroids. -Advance activity as tolerated. -Wean down oxygen as tolerated. -Patient appears to have severe disease at baseline, and may have a prolonged recovery.  My office will arrange a follow-up for the patient in 1 to 2 weeks.   Date: 09/09/2017  MRN# [id number] Vincent Giles 05-Nov-1950   Vincent Giles is a 67 y.o. old male seen in follow up for chief complaint of  Chief Complaint  Patient presents with  . Chest Pain  . Shortness of Breath     Subjective.  Patient is feeling better today, though he still feels that he is far from his baseline.  He has no new complaints today.    Medication:    Current Facility-Administered Medications:  .  acetaminophen (TYLENOL) tablet 650 mg, 650 mg, Oral, Q6H PRN **OR** [DISCONTINUED] acetaminophen (TYLENOL) suppository 650 mg, 650 mg, Rectal, Q6H PRN, Sudini, Srikar, MD .  albuterol (PROVENTIL) (2.5 MG/3ML) 0.083% nebulizer solution 2.5 mg, 2.5 mg, Nebulization, Q2H PRN, Sudini, Srikar, MD .  atorvastatin (LIPITOR) tablet 40 mg, 40 mg, Oral, QHS, Sudini, Srikar, MD, 40 mg at 09/08/17 2221 .  budesonide (PULMICORT) nebulizer solution 0.25 mg, 0.25 mg, Nebulization, BID, Sudini, Srikar, MD .  enoxaparin (LOVENOX) injection 40 mg, 40 mg, Subcutaneous, Q24H, Sudini, Srikar, MD, 40 mg at 09/08/17 2221 .  escitalopram (LEXAPRO) tablet 10 mg, 10 mg, Oral, Daily, Sudini, Srikar, MD, 10 mg at 09/09/17 0931 .  furosemide (LASIX) injection 40 mg, 40 mg, Intravenous, Daily, Sudini, Srikar, MD, 40 mg at 09/09/17 0931 .  guaiFENesin-dextromethorphan (ROBITUSSIN DM) 100-10 MG/5ML syrup 5 mL, 5 mL, Oral, Q4H PRN, Harlon DittyKeene, Jeremiah D, NP, 5 mL at 09/08/17 1413 .  ipratropium-albuterol (DUONEB) 0.5-2.5 (3) MG/3ML nebulizer solution 3 mL, 3 mL, Nebulization, Q6H, Merwyn KatosSimonds, David B, MD, 3 mL  at 09/09/17 1422 .  MEDLINE mouth rinse, 15 mL, Mouth Rinse, BID, Eugenie NorrieBlakeney, Dana G, NP, 15 mL at 09/09/17 0931 .  methylPREDNISolone sodium succinate (SOLU-MEDROL) 40 mg/mL injection 40 mg, 40 mg, Intravenous, Q12H, Merwyn KatosSimonds, David B, MD, 40 mg at 09/09/17 0537 .  nicotine (NICODERM CQ - dosed in mg/24 hours) patch 14 mg, 14 mg, Transdermal, Daily, Shaune Pollackhen, Qing, MD .  [DISCONTINUED] ondansetron [facility](ZOFRAN) tablet 4 mg, 4 mg, Oral, Q6H PRN **OR** ondansetron (ZOFRAN) injection 4 mg, 4 mg, Intravenous, Q6H PRN, Sudini, Srikar, MD .  polyethylene glycol (MIRALAX / GLYCOLAX) packet 17 g, 17 g, Oral, Daily PRN, Milagros LollSudini, Srikar, MD, 17 g at 09/08/17 1810   Allergies:  Patient has no known allergies.  Review of Systems:  Constitutional: Feels well. Cardiovascular: No chest pain.  Pulmonary: Denies hemoptysis. The remainder of systems were reviewed and were found to be negative other than what is documented in the HPI.    Physical Examination:   VS: BP 130/71 (BP Location: Right Arm)   Pulse 69   Temp 97.9 F (36.6 C) (Oral)   Resp 19   Ht 6\' 3"  (1.905 m)   Wt 89.2 kg   SpO2 99%   BMI 24.58 kg/m   General Appearance: No distress  Neuro:without focal findings, mental status, speech normal, alert and oriented HEENT: PERRLA, EOM intact Pulmonary: Poor air entry bilaterally. CardiovascularNormal S1,S2.  No m/r/g.  Abdomen: Benign, Soft, non-tender, No masses Renal:  No costovertebral tenderness  GU:  No performed at this time. Endoc: No evident  thyromegaly, no signs of acromegaly or Cushing features Skin:   warm, no rashes, no ecchymosis  Extremities: normal, no cyanosis, clubbing.      LABORATORY PANEL:   CBC Recent Labs  Lab 09/08/17 0551  WBC 7.4  HGB 15.5  HCT 45.0  PLT 178   ------------------------------------------------------------------------------------------------------------------  Chemistries  Recent Labs  Lab 09/08/17 0551  NA 139  K 4.1  CL 99  CO2 37*    GLUCOSE 137*  BUN 16  CREATININE 0.56*  CALCIUM 8.8*   ------------------------------------------------------------------------------------------------------------------  Cardiac Enzymes Recent Labs  Lab 09/07/17 1109  TROPONINI <0.03   ------------------------------------------------------------  RADIOLOGY:   No results found for this or any previous visit. No results found for this or any previous visit. ------------------------------------------------------------------------------------------------------------------  Thank  you for allowing [facility] to assist in the care of your patient. Our recommendations are noted above.  Please contact us if we can be of further service.   Wells Guileseep Janis Sol, M.D., F.C.C.P.  Board Certified in Internal Medicine, Pulmonary Medicine, Critical Care Medicine, and Sleep Medicine.  [facility] Office Number: [phone]  09/09/2017

## 2017-09-09 NOTE — Progress Notes (Signed)
SATURATION QUALIFICATIONS: (This note is used to comply with regulatory documentation for home oxygen)  Patient Saturations on Room Air at Rest = 92%  Patient Saturations on Room Air while Ambulating = 78%  Patient Saturations on 2 Liters of oxygen while Ambulating = 92%  Please briefly explain why patient needs home oxygen: Patient gets short of breath and dizzy while ambulating on room air. O2 sats dropped to 78% after after walking  10 steps.

## 2017-09-09 NOTE — Progress Notes (Signed)
   [employer] [facility] - [place] at [facility]   PATIENT NAME: Vincent Giles    MR#:  [id number]  DATE OF BIRTH:  10-26-1950  SUBJECTIVE:  CHIEF COMPLAINT:   Chief Complaint  Patient presents with  . Chest Pain  . Shortness of Breath   Continues to have shortness of breath and on 2 L oxygen.  Afebrile.  Ambulating to the bathroom and back with significant shortness of breath.  REVIEW OF SYSTEMS:  Review of Systems  Constitutional: Positive for malaise/fatigue. Negative for chills and fever.  HENT: Negative for sore throat.   Eyes: Negative for blurred vision, double vision and pain.  Respiratory: Positive for cough, shortness of breath and wheezing. Negative for hemoptysis.   Cardiovascular: Negative for chest pain, palpitations, orthopnea and leg swelling.  Gastrointestinal: Negative for abdominal pain, constipation, diarrhea, heartburn, nausea and vomiting.  Genitourinary: Negative for dysuria and hematuria.  Musculoskeletal: Negative for back pain and joint pain.  Skin: Negative for rash.  Neurological: Negative for sensory change, speech change, focal weakness and headaches.  Endo/Heme/Allergies: Does not bruise/bleed easily.  Psychiatric/Behavioral: Negative for depression. The patient is not nervous/anxious.     DRUG ALLERGIES:  No Known Allergies VITALS:  Blood pressure 130/71, pulse 69, temperature 97.9 F (36.6 C), temperature source Oral, resp. rate 19, height 6\' 3"  (1.905 m), weight 89.2 kg, SpO2 99 %. PHYSICAL EXAMINATION:  Physical Exam  Constitutional: He is oriented to person, place, and time. He appears well-developed. He appears distressed.  HENT:  Head: Normocephalic and atraumatic.  Eyes: Pupils are equal, round, and reactive to light. EOM are normal.  Neck: Normal range of motion. Neck supple.  Cardiovascular: Regular rhythm.  Pulmonary/Chest: Tachypnea noted. He is in respiratory distress. He has decreased breath sounds in the right upper  field, the right middle field, the right lower field, the left upper field, the left middle field and the left lower field.  Abdominal: Soft. Bowel sounds are normal. There is no tenderness.  Musculoskeletal: Normal range of motion.       Right lower leg: He exhibits edema.       Left lower leg: He exhibits edema.  Neurological: He is alert and oriented to person, place, and time.  Skin: Skin is warm and dry.   LABORATORY PANEL:  Male CBC Recent Labs  Lab 09/08/17 0551  WBC 7.4  HGB 15.5  HCT 45.0  PLT 178   ------------------------------------------------------------------------------------------------------------------ Chemistries  Recent Labs  Lab 09/08/17 0551  NA 139  K 4.1  CL 99  CO2 37*  GLUCOSE 137*  BUN 16  CREATININE 0.56*  CALCIUM 8.8*   RADIOLOGY:  No results found. ASSESSMENT AND PLAN:   *Acute respiratory failure with hypoxia and hypercapnia due to COPD exacerbation. -IV steroids, Antibiotics - Scheduled Nebulizers - Inhalers -Wean O2 as tolerated  *Hypertension.  Held blood pressure medications due to low normal blood pressure  *CAD status post CABG. Continue home medication.  Tobacco abuse.  Counseled on admission.  All the records are reviewed and case discussed with Care Management/Social Worker. Management plans discussed with the patient, family and they are in agreement.  CODE STATUS: DNR  TOTAL TIME TAKING CARE OF THIS PATIENT: 35 minutes.   POSSIBLE D/C IN 2-3 DAYS, DEPENDING ON CLINICAL CONDITION.  Orie FishermanSrikar R Evelise Reine M.D on 09/09/2017 at 1:21 PM  Between 7am to 6pm - Pager - [phone]  After 6pm go to [email or web address] - Therapist, nutritionalpassword EPAS [facility]  [facility] [place] Hospitalists

## 2017-09-10 MED ORDER — FUROSEMIDE 10 MG/ML IJ SOLN
40.0000 mg | Freq: Every day | INTRAMUSCULAR | Status: AC
  Administered 2017-09-10: 40 mg via INTRAVENOUS
  Filled 2017-09-10: qty 4

## 2017-09-10 NOTE — Care Management Important Message (Signed)
Important Message  Patient Details  Name: Vincent Giles MRN: [id number] Date of Birth: November 08, 1950   Medicare Important Message Given:  Yes    Olegario MessierKathy A Dorreen Valiente 09/10/2017, 11:20 AM

## 2017-09-10 NOTE — Progress Notes (Signed)
Pt refused 0200 hr treatment.  

## 2017-09-10 NOTE — Progress Notes (Signed)
[facility]* [facility]  Pulmonary Medicine     Assessment and Plan:  Acute exacerbation of COPD with acute hypoxic respiratory failure, slow recovery. Nicotine abuse.  -Continue nebulizers steroids -Advance activity as tolerated -Wean down oxygen as tolerated. --OK to dc home from respiratory standpoint.  My office will arrange a follow-up for the patient in 1 to 2 weeks.   Date: 09/10/2017  MRN# [id number] Vincent Giles 11/20/1950   Vincent Giles is a 67 y.o. old male seen in follow up for chief complaint of  Chief Complaint  Patient presents with  . Chest Pain  . Shortness of Breath     Subjective.  Patient is feeling better today. He has no new complaints today. The patient has been weaned down to 2 L of oxygen.    Medication:    Current Facility-Administered Medications:  .  acetaminophen (TYLENOL) tablet 650 mg, 650 mg, Oral, Q6H PRN **OR** [DISCONTINUED] acetaminophen (TYLENOL) suppository 650 mg, 650 mg, Rectal, Q6H PRN, Sudini, Srikar, MD .  albuterol (PROVENTIL) (2.5 MG/3ML) 0.083% nebulizer solution 2.5 mg, 2.5 mg, Nebulization, Q2H PRN, Sudini, Srikar, MD .  atorvastatin (LIPITOR) tablet 40 mg, 40 mg, Oral, QHS, Sudini, Srikar, MD, 40 mg at 09/09/17 2234 .  budesonide (PULMICORT) nebulizer solution 0.25 mg, 0.25 mg, Nebulization, BID, Sudini, Srikar, MD, 0.25 mg at 09/10/17 0738 .  enoxaparin (LOVENOX) injection 40 mg, 40 mg, Subcutaneous, Q24H, Sudini, Srikar, MD, 40 mg at 09/09/17 2235 .  escitalopram (LEXAPRO) tablet 10 mg, 10 mg, Oral, Daily, Sudini, Srikar, MD, 10 mg at 09/10/17 1014 .  guaiFENesin-dextromethorphan (ROBITUSSIN DM) 100-10 MG/5ML syrup 5 mL, 5 mL, Oral, Q4H PRN, Harlon DittyKeene, Jeremiah D, NP, 5 mL at 09/09/17 1623 .  ipratropium-albuterol (DUONEB) 0.5-2.5 (3) MG/3ML nebulizer solution 3 mL, 3 mL, Nebulization, Q6H, Merwyn KatosSimonds, David B, MD, 3 mL at 09/10/17 0738 .  MEDLINE mouth rinse, 15 mL, Mouth Rinse, BID, Eugenie NorrieBlakeney, Dana G, NP, 15 mL at 09/09/17 2235 .   methylPREDNISolone sodium succinate (SOLU-MEDROL) 40 mg/mL injection 40 mg, 40 mg, Intravenous, Q12H, Merwyn KatosSimonds, David B, MD, 40 mg at 09/10/17 0543 .  nicotine (NICODERM CQ - dosed in mg/24 hours) patch 14 mg, 14 mg, Transdermal, Daily, Shaune Pollackhen, Qing, MD .  [DISCONTINUED] ondansetron [facility](ZOFRAN) tablet 4 mg, 4 mg, Oral, Q6H PRN **OR** ondansetron (ZOFRAN) injection 4 mg, 4 mg, Intravenous, Q6H PRN, Sudini, Srikar, MD .  polyethylene glycol (MIRALAX / GLYCOLAX) packet 17 g, 17 g, Oral, Daily PRN, Milagros LollSudini, Srikar, MD, 17 g at 09/08/17 1810   Allergies:  Patient has no known allergies.  Review of Systems:  Constitutional: Feels well. Cardiovascular: No chest pain.  Pulmonary: Denies new dyspnea.   The remainder of systems were reviewed and were found to be negative other than what is documented in the HPI.    Physical Examination:   VS: BP (!) 184/93 (BP Location: Right Arm)   Pulse 85   Temp 98 F (36.7 C) (Oral)   Resp (!) 22   Ht 6\' 3"  (1.905 m)   Wt 87.1 kg   SpO2 96%   BMI 24.00 kg/m   General Appearance: No distress  Neuro:without focal findings, mental status, speech normal, alert and oriented HEENT: PERRLA, EOM intact Pulmonary: decreased air entry bilaterally.  CardiovascularNormal S1,S2.  No m/r/g.  Abdomen: Benign, Soft, non-tender, No masses Renal:  No costovertebral tenderness  GU:  No performed at this time. Endoc: No evident thyromegaly, no signs of acromegaly or Cushing features Skin:   warm, no rashes, no  ecchymosis  Extremities: normal, no cyanosis, clubbing.      LABORATORY PANEL:   CBC Recent Labs  Lab 09/08/17 0551  WBC 7.4  HGB 15.5  HCT 45.0  PLT 178   ------------------------------------------------------------------------------------------------------------------  Chemistries  Recent Labs  Lab 09/08/17 0551  NA 139  K 4.1  CL 99  CO2 37*  GLUCOSE 137*  BUN 16  CREATININE 0.56*  CALCIUM 8.8*    ------------------------------------------------------------------------------------------------------------------  Cardiac Enzymes Recent Labs  Lab 09/07/17 1109  TROPONINI <0.03   ------------------------------------------------------------  RADIOLOGY:   No results found for this or any previous visit. No results found for this or any previous visit. ------------------------------------------------------------------------------------------------------------------  Thank  you for allowing [facility] to assist in the care of your patient. Our recommendations are noted above.  Please contact us if we can be of further service.   Wells Guileseep Jaysha Lasure, M.D., F.C.C.P.  Board Certified in Internal Medicine, Pulmonary Medicine, Critical Care Medicine, and Sleep Medicine.  [place] [facility] Office Number: [phone]  09/10/2017

## 2017-09-10 NOTE — Progress Notes (Signed)
   [facility] [place] at [facility]   PATIENT NAME: Vincent Giles    MR#:  [id number]  DATE OF BIRTH:  1950-11-19  SUBJECTIVE:  CHIEF COMPLAINT:   Chief Complaint  Patient presents with  . Chest Pain  . Shortness of Breath   Up and walking but still SOB.  REVIEW OF SYSTEMS:  Review of Systems  Constitutional: Positive for malaise/fatigue. Negative for chills and fever.  HENT: Negative for sore throat.   Eyes: Negative for blurred vision, double vision and pain.  Respiratory: Positive for cough, shortness of breath and wheezing. Negative for hemoptysis.   Cardiovascular: Negative for chest pain, palpitations, orthopnea and leg swelling.  Gastrointestinal: Negative for abdominal pain, constipation, diarrhea, heartburn, nausea and vomiting.  Genitourinary: Negative for dysuria and hematuria.  Musculoskeletal: Negative for back pain and joint pain.  Skin: Negative for rash.  Neurological: Negative for sensory change, speech change, focal weakness and headaches.  Endo/Heme/Allergies: Does not bruise/bleed easily.  Psychiatric/Behavioral: Negative for depression. The patient is not nervous/anxious.     DRUG ALLERGIES:  No Known Allergies VITALS:  Blood pressure (!) 184/93, pulse 85, temperature 98 F (36.7 C), temperature source Oral, resp. rate (!) 22, height 6\' 3"  (1.905 m), weight 87.1 kg, SpO2 96 %. PHYSICAL EXAMINATION:  Physical Exam  Constitutional: He is oriented to person, place, and time. He appears well-developed. He appears distressed.  HENT:  Head: Normocephalic and atraumatic.  Eyes: Pupils are equal, round, and reactive to light. EOM are normal.  Neck: Normal range of motion. Neck supple.  Cardiovascular: Regular rhythm.  Pulmonary/Chest: Tachypnea noted. He is in respiratory distress. He has decreased breath sounds in the right upper field, the right middle field, the right lower field, the left upper field, the left middle field and the left  lower field.  Abdominal: Soft. Bowel sounds are normal. There is no tenderness.  Musculoskeletal: Normal range of motion.       Right lower leg: He exhibits edema.       Left lower leg: He exhibits edema.  Neurological: He is alert and oriented to person, place, and time.  Skin: Skin is warm and dry.   LABORATORY PANEL:  Male CBC Recent Labs  Lab 09/08/17 0551  WBC 7.4  HGB 15.5  HCT 45.0  PLT 178   ------------------------------------------------------------------------------------------------------------------ Chemistries  Recent Labs  Lab 09/08/17 0551  NA 139  K 4.1  CL 99  CO2 37*  GLUCOSE 137*  BUN 16  CREATININE 0.56*  CALCIUM 8.8*   RADIOLOGY:  No results found. ASSESSMENT AND PLAN:   *Acute respiratory failure with hypoxia and hypercapnia due to COPD exacerbation. -IV steroids - Scheduled Nebulizers - Inhalers -Wean O2 as tolerated Appreciate pulmonary input  *Hypertension BP well controlled without meds One spike but will monitor  *CAD status post CABG Continue home medication  Tobacco abuse.  Counseled on admission  All the records are reviewed and case discussed with Care Management/Social Worker. Management plans discussed with the patient, family and they are in agreement.  CODE STATUS: DNR  TOTAL TIME TAKING CARE OF THIS PATIENT: 35 minutes.   POSSIBLE D/C IN 1-2 DAYS, DEPENDING ON CLINICAL CONDITION.  Molinda BailiffSrikar R Luticia Tadros M.D on 09/10/2017 at 1:14 PM  Between 7am to 6pm - Pager - [phone]  After 6pm go to [email or web address] - Therapist, nutritionalpassword [email or web address]  [facility] [place] [facility]

## 2017-09-10 NOTE — Care Management (Signed)
Patient has qualifying O2 sats. [facility] It is anticipated patient will discharge home tomorrow.

## 2017-09-10 NOTE — Plan of Care (Signed)
  Problem: Education: Goal: Knowledge of General Education information will improve Description Including pain rating scale, medication(s)/side effects and non-pharmacologic comfort measures Outcome: Progressing   Problem: Health Behavior/Discharge Planning: Goal: Ability to manage health-related needs will improve Outcome: Progressing   Problem: Clinical Measurements: Goal: Ability to maintain clinical measurements within normal limits will improve Outcome: Progressing Goal: Will remain free from infection Outcome: Progressing Goal: Diagnostic test results will improve Outcome: Progressing Goal: Respiratory complications will improve Outcome: Progressing Goal: Cardiovascular complication will be avoided Outcome: Progressing   Problem: Activity: Goal: Risk for activity intolerance will decrease Outcome: Progressing   Problem: Activity: Goal: Risk for activity intolerance will decrease Outcome: Progressing   Problem: Clinical Measurements: Goal: Ability to maintain clinical measurements within normal limits will improve Outcome: Progressing Goal: Will remain free from infection Outcome: Progressing Goal: Diagnostic test results will improve Outcome: Progressing Goal: Respiratory complications will improve Outcome: Progressing Goal: Cardiovascular complication will be avoided Outcome: Progressing

## 2017-09-11 MED ORDER — IPRATROPIUM-ALBUTEROL 0.5-2.5 (3) MG/3ML IN SOLN: 3.0000 mL | Freq: Four times a day (QID) | RESPIRATORY_TRACT | 0 refills | Status: AC

## 2017-09-11 MED ORDER — PREDNISONE 10 MG (21) PO TBPK: ORAL_TABLET | ORAL | 0 refills | Status: AC

## 2017-09-11 NOTE — Plan of Care (Signed)

## 2017-09-11 NOTE — Progress Notes (Signed)
SATURATION QUALIFICATIONS: (This note is used to comply with regulatory documentation for home oxygen)  Patient Saturations on Room Air at Rest = 95%  Patient Saturations on Room Air while Ambulating = 93%   

## 2017-09-11 NOTE — Progress Notes (Signed)
Scripts given. Patient also has oxygen and nebulizer at the bedside.

## 2017-09-11 NOTE — Care Management (Signed)
Patient will have to be re qualified for oxygen as the previous qualification has expired.  Patient does not have Medicare B so does not coverage for oxygen.  He is declining home health and does not know if he has part D coverage for medications.  Says he receives his meds through the [facility] by mail.  Says he has "new supply" of all his meds.  Informed by [facility] that patient will also need home nebulizer machine.  It is very difficult to discuss discharge planning with patient. He strays off topic and does not focus on current discussion.  Found that patient does not have medicare part D coverage but confirmed he receives meds through the [facility].  Patient "not interested in discussing anything about meds any further."  Patient let the unit before CM could attempt to discuss discharge disposition again.

## 2017-09-12 NOTE — Care Management (Signed)
late entry  [facility] provided patient with oxygen and nebulizer under [facility].

## 2017-09-14 ENCOUNTER — Telehealth: Payer: Self-pay

## 2017-09-14 NOTE — Telephone Encounter (Signed)
Flagged on EMMI report for not having a follow up scheduled.  Per chart, patient has a follow up scheduled on 10/02/17 with Dr. Nicholos Johnsamachandran.  Called and spoke with patient and reminded him of appointment.  He is aware to follow up with them.  He also is seen by the [facility] and wanted to know if he needed to have a follow up with them.  Advised him to contact the [facility] regarding scheduling an appointment so he can see his PCP.  Patient mentioned he has had some anxiety about falling asleep as he is worried he will smother like he did prior to coming to the hospital.  He said he is using his nebulizer and has been wearing his oxygen, but not consistently.  Had questions about why he would get short of breath at times when other times he was fine. Encouraged him to contact Dr. Carolyn Stareamachandran's office with any questions as they would be able to advise him.  No other questions at this time.  I thanked him for his time and informed him that he would receive one more automated call checking on him in the next few days.

## 2017-09-17 ENCOUNTER — Telehealth: Payer: Self-pay

## 2017-09-17 NOTE — Telephone Encounter (Signed)
EMMI Follow-up: Noted on the report that patient didn't have a follow-up appointment.  I talked with [name] and he wasn't aware he had a follow-up appointment on Friday, September 6 at 11 am with Dr. Nicholos Johnsamachandran so he thanked me for calling. Said everything was going well and no needs today.

## 2017-09-24 NOTE — Discharge Summary (Signed)
[facility] [place] at [facility]   PATIENT NAME: Vincent Giles    MR#:  [id number]  DATE OF BIRTH:  July 16, 1950  DATE OF ADMISSION:  09/07/2017 ADMITTING PHYSICIAN: Milagros Loll, MD  DATE OF DISCHARGE: 09/11/2017  3:19 PM  PRIMARY CARE PHYSICIAN: [facility]   ADMISSION DIAGNOSIS:  COPD exacerbation (HCC) [J44.1] Acute respiratory failure with hypoxia and hypercapnia (HCC) [J96.01, J96.02]  DISCHARGE DIAGNOSIS:  Active Problems:   COPD exacerbation (HCC)   SECONDARY DIAGNOSIS:   Past Medical History:  Diagnosis Date  . COPD (chronic obstructive pulmonary disease) (HCC)   . Hypertension      ADMITTING HISTORY  HISTORY OF PRESENT ILLNESS:  Vincent Giles  is a 67 y.o. male with a known history of COPD, tobacco use, hypertension, CAD status post CABG in 2003 presents to the emergency room complaining of worsening shortness of breath, wheezing and weakness since Saturday.  On arrival patient was found to have saturations in the low 80s and tachypneic.  Placed on BiPAP support.  Attempt was made to remove BiPAP but patient had to be replaced on it.  Chest x-ray shows no acute edema or infiltrates.  He does have clear productive sputum.  Afebrile.  Patient continues to smoke instead of having advanced COPD.  No chest pain or orthopnea.  Has chronic lower extremity edema that is unchanged.  He tells me he has a new physician in town whose name he does not remember.   HOSPITAL COURSE:   *Acute respiratory failure with hypoxia and hypercapnia due to COPD exacerbation. Patient admitted to stepdown unit on BiPAP on admission.  Started on IV steroids, nebulizers.  Inhalers continued.  Pulmonary consulted.  Patient was followed by pulmonary during hospital stay.  Slowly he was able to come off his BiPAP onto nasal cannula.  By the day of discharge he feels significantly improved.  Will be discharged home on inhalers, prednisone taper.  *Hypertension BP well  controlled without meds  *CAD status post CABG Continue home medication  Patient stable for discharge home to follow-up with primary care physician.  CONSULTS OBTAINED:  Treatment Team:  Merwyn Katos, MD  DRUG ALLERGIES:  No Known Allergies  DISCHARGE MEDICATIONS:   Allergies as of 09/11/2017   No Known Allergies     Medication List    STOP taking these medications   metoprolol tartrate 50 MG tablet Commonly known as:  LOPRESSOR     TAKE these medications   albuterol 108 (90 Base) MCG/ACT inhaler Commonly known as:  PROVENTIL HFA;VENTOLIN HFA Inhale 2 puffs into the lungs every 4 (four) hours as needed for wheezing or shortness of breath.   atorvastatin 80 MG tablet Commonly known as:  LIPITOR Take 40 mg by mouth at bedtime.   diltiazem 180 MG 24 hr capsule Commonly known as:  DILACOR XR Take 180 mg by mouth daily.   escitalopram 10 MG tablet Commonly known as:  LEXAPRO Take 10 mg by mouth daily.   hydrochlorothiazide 25 MG tablet Commonly known as:  HYDRODIURIL Take 25 mg by mouth daily.   ipratropium-albuterol 0.5-2.5 (3) MG/3ML Soln Commonly known as:  DUONEB Take 3 mLs by nebulization every 6 (six) hours.   potassium chloride SA 20 MEQ tablet Commonly known as:  K-DUR,KLOR-CON Take 20 mEq by mouth 2 (two) times daily.   predniSONE 10 MG (21) Tbpk tablet Commonly known as:  STERAPRED UNI-PAK 21 TAB 6 tabs day 1 and taper 10 mg a day -  6 days   tiotropium 18 MCG inhalation capsule Commonly known as:  SPIRIVA Place 18 mcg into inhaler and inhale daily.            Durable Medical Equipment  (From admission, onward)         Start     Ordered   09/11/17 0000  DME Nebulizer machine    Question:  Patient needs a nebulizer to treat with the following condition  Answer:  COPD (chronic obstructive pulmonary disease) (HCC)   09/11/17 1055          Today   VITAL SIGNS:  Blood pressure 124/87, pulse 81, temperature 98.2 F (36.8 C),  temperature source Oral, resp. rate 18, height 6\' 3"  (1.905 m), weight 88.2 kg, SpO2 97 %.  I/O:  No intake or output data in the 24 hours ending 09/24/17 1351  PHYSICAL EXAMINATION:  Physical Exam  GENERAL:  67 y.o.-year-old patient lying in the bed with no acute distress.  LUNGS: Normal breath sounds bilaterally, no wheezing, rales,rhonchi or crepitation. No use of accessory muscles of respiration.  CARDIOVASCULAR: S1, S2 normal. No murmurs, rubs, or gallops.  ABDOMEN: Soft, non-tender, non-distended. Bowel sounds present. No organomegaly or mass.  NEUROLOGIC: Moves all 4 extremities. PSYCHIATRIC: The patient is alert and oriented x 3.  SKIN: No obvious rash, lesion, or ulcer.   DATA REVIEW:   CBC No results for input(s): WBC, HGB, HCT, PLT in the last 168 hours.  Chemistries  No results for input(s): NA, K, CL, CO2, GLUCOSE, BUN, CREATININE, CALCIUM, MG, AST, ALT, ALKPHOS, BILITOT in the last 168 hours.  Invalid input(s): GFRCGP  Cardiac Enzymes No results for input(s): TROPONINI in the last 168 hours.  Microbiology Results  No results found for this or any previous visit.  RADIOLOGY:  No results found.  Follow up with PCP in 1 week.  Management plans discussed with the patient, family and they are in agreement.  CODE STATUS:  Code Status History    Date Active Date Inactive Code Status Order ID Comments User Context   09/07/2017 1405 09/11/2017 1825 DNR [id number]  Milagros LollSudini, Jase Himmelberger, MD ED    Questions for Most Recent Historical Code Status (Order [id number])    Question Answer Comment   In the event of cardiac or respiratory ARREST Do not call a "code blue"    In the event of cardiac or respiratory ARREST Do not perform Intubation, CPR, defibrillation or ACLS    In the event of cardiac or respiratory ARREST Use medication by any route, position, wound care, and other measures to relive pain and suffering. May use oxygen, suction and manual treatment of airway obstruction  as needed for comfort.       TOTAL TIME TAKING CARE OF THIS PATIENT ON DAY OF DISCHARGE: more than 30 minutes.   Molinda BailiffSrikar R Wilfred Siverson M.D on 09/24/2017 at 1:51 PM  Between 7am to 6pm - Pager - [phone]  After 6pm go to [email or web address] - password [email or web address]  [facility] [place] [facility]  Office  [phone]  CC: Primary care physician; [facility]  Note: This dictation was prepared with Dragon dictation along with smaller phrase technology. Any transcriptional errors that result from this process are unintentional.

## 2017-10-02 ENCOUNTER — Inpatient Hospital Stay: Payer: Self-pay | Admitting: Internal Medicine

## 2018-05-28 DEATH — deceased

## 2018-06-29 NOTE — Telephone Encounter (Signed)
Closing Encounter - No note for this date.

## 2019-07-27 IMAGING — DX DG CHEST 1V PORT
1 series · 1 of 1 positions shown · non-contrast
Comparison: none

CLINICAL DATA: CP and SOB that started apprix 8086 09/06/17. Pt
appears SOB. Unable to lie back, unable to speak in full sentences

EXAM:
PORTABLE CHEST - 1 VIEW

[chest ap]
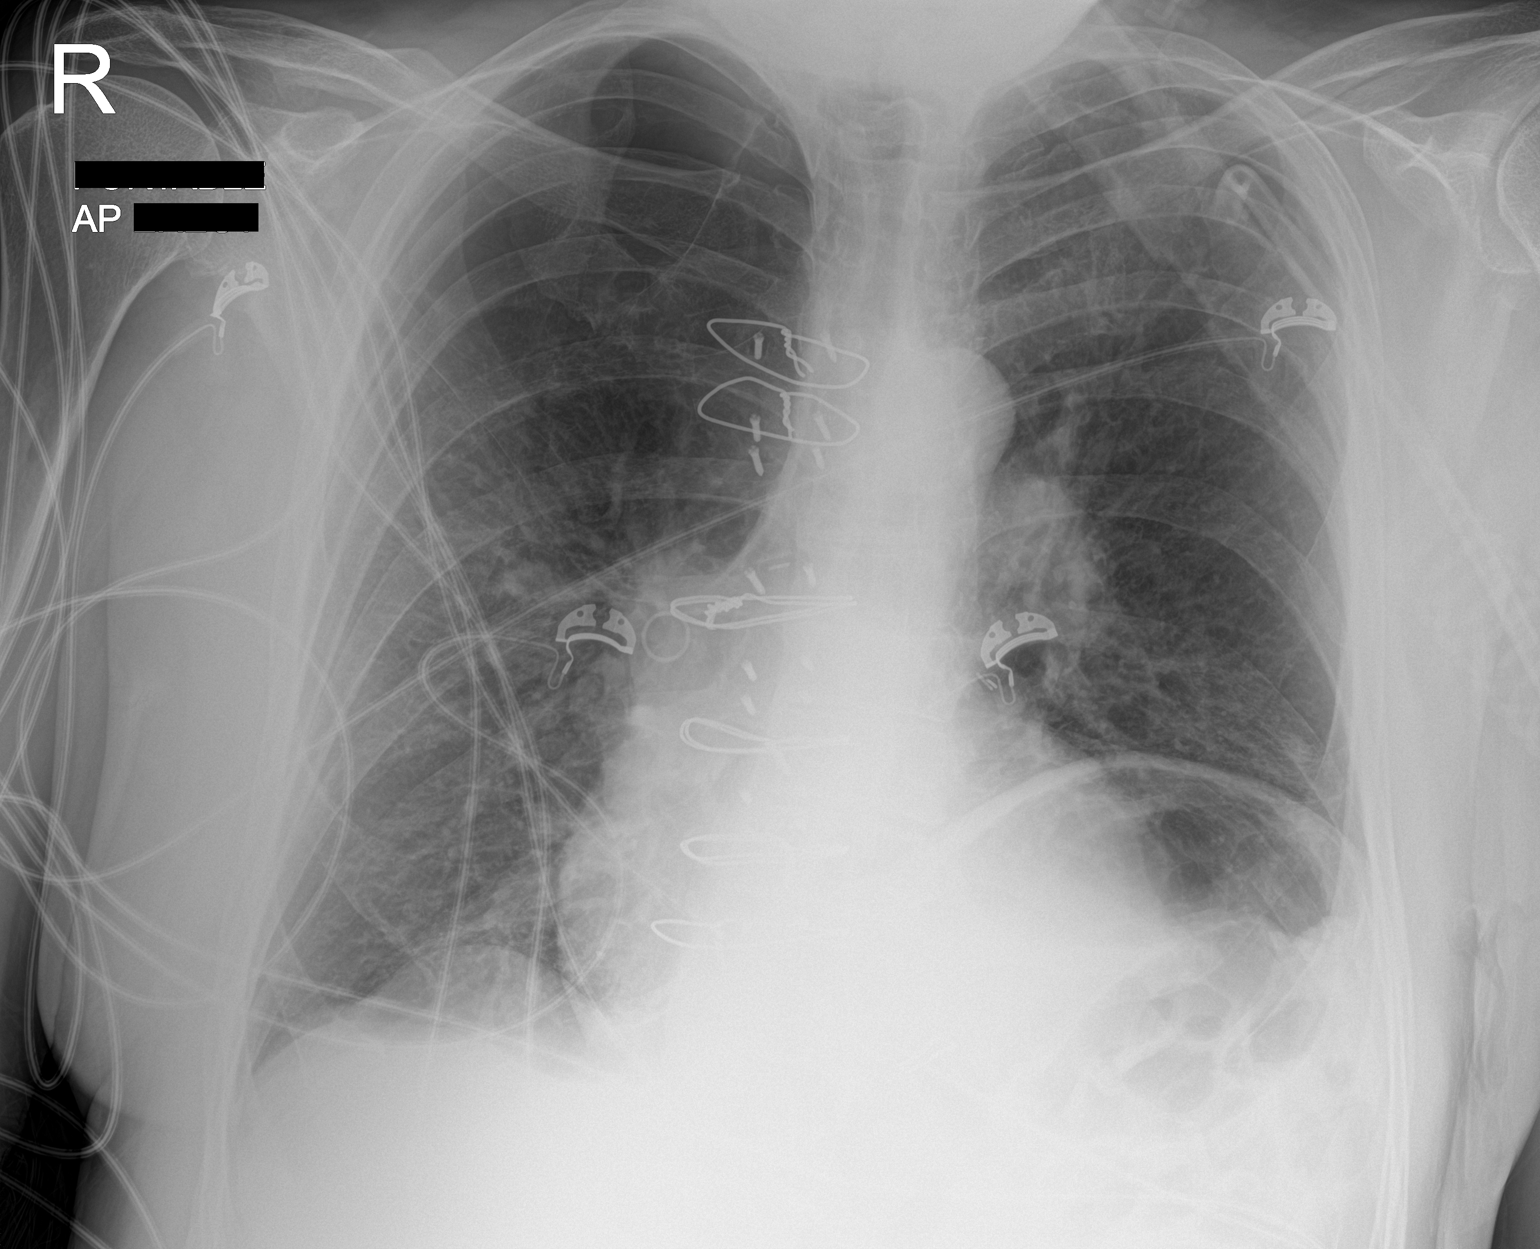

[1 of 1 positions shown; findings below may reference images not displayed]

FINDINGS: Attenuated bronchovascular markings at the right lung apex
suggesting severe bullous disease. No focal airspace disease or
overt edema.

Heart size normal.  Previous CABG.

No effusion.  No pneumothorax.

Previous median sternotomy.
IMPRESSION: 1. No acute disease post CABG.
2. Probable bullous disease in the right upper lung.
